# Patient Record
Sex: Female | Born: 1950 | Race: Black or African American | Hispanic: No | Smoking: Never smoker
Health system: Southern US, Community
[De-identification: ages and names within clinical notes are randomized; demographics above are authoritative.]

## PROBLEM LIST (undated history)

## (undated) DIAGNOSIS — Z973 Presence of spectacles and contact lenses: Secondary | ICD-10-CM

## (undated) HISTORY — PX: DENTAL SURGERY: SHX609

## (undated) HISTORY — PX: COLONOSCOPY: SHX174

---

## 1998-01-21 ENCOUNTER — Ambulatory Visit (HOSPITAL_COMMUNITY): Admission: RE | Admit: 1998-01-21 | Discharge: 1998-01-21 | Payer: Self-pay | Admitting: Obstetrics and Gynecology

## 1998-04-26 ENCOUNTER — Other Ambulatory Visit: Admission: RE | Admit: 1998-04-26 | Discharge: 1998-04-26 | Payer: Self-pay | Admitting: Obstetrics and Gynecology

## 1998-05-21 ENCOUNTER — Emergency Department (HOSPITAL_COMMUNITY): Admission: EM | Admit: 1998-05-21 | Discharge: 1998-05-21 | Payer: Self-pay | Admitting: Emergency Medicine

## 1998-07-27 ENCOUNTER — Other Ambulatory Visit: Admission: RE | Admit: 1998-07-27 | Discharge: 1998-07-27 | Payer: Self-pay | Admitting: Obstetrics and Gynecology

## 1999-03-28 ENCOUNTER — Other Ambulatory Visit: Admission: RE | Admit: 1999-03-28 | Discharge: 1999-03-28 | Payer: Self-pay | Admitting: Obstetrics and Gynecology

## 1999-04-21 ENCOUNTER — Ambulatory Visit (HOSPITAL_COMMUNITY): Admission: RE | Admit: 1999-04-21 | Discharge: 1999-04-21 | Payer: Self-pay | Admitting: Obstetrics and Gynecology

## 1999-04-21 ENCOUNTER — Encounter: Payer: Self-pay | Admitting: Obstetrics and Gynecology

## 2000-01-25 ENCOUNTER — Other Ambulatory Visit: Admission: RE | Admit: 2000-01-25 | Discharge: 2000-01-25 | Payer: Self-pay | Admitting: Obstetrics and Gynecology

## 2002-02-03 ENCOUNTER — Other Ambulatory Visit: Admission: RE | Admit: 2002-02-03 | Discharge: 2002-02-03 | Payer: Self-pay | Admitting: Obstetrics and Gynecology

## 2002-02-12 ENCOUNTER — Ambulatory Visit (HOSPITAL_COMMUNITY): Admission: RE | Admit: 2002-02-12 | Discharge: 2002-02-12 | Payer: Self-pay | Admitting: Obstetrics and Gynecology

## 2002-02-12 ENCOUNTER — Encounter: Payer: Self-pay | Admitting: Obstetrics and Gynecology

## 2004-08-03 ENCOUNTER — Encounter: Admission: RE | Admit: 2004-08-03 | Discharge: 2004-08-03 | Payer: Self-pay | Admitting: Cardiology

## 2004-08-04 ENCOUNTER — Ambulatory Visit (HOSPITAL_COMMUNITY): Admission: RE | Admit: 2004-08-04 | Discharge: 2004-08-04 | Payer: Self-pay | Admitting: Cardiovascular Disease

## 2004-08-09 ENCOUNTER — Other Ambulatory Visit: Admission: RE | Admit: 2004-08-09 | Discharge: 2004-08-09 | Payer: Self-pay | Admitting: Obstetrics and Gynecology

## 2004-08-12 ENCOUNTER — Ambulatory Visit (HOSPITAL_COMMUNITY): Admission: RE | Admit: 2004-08-12 | Discharge: 2004-08-12 | Payer: Self-pay | Admitting: Obstetrics and Gynecology

## 2006-07-17 ENCOUNTER — Other Ambulatory Visit: Admission: RE | Admit: 2006-07-17 | Discharge: 2006-07-17 | Payer: Self-pay | Admitting: Obstetrics and Gynecology

## 2006-11-02 ENCOUNTER — Ambulatory Visit (HOSPITAL_COMMUNITY): Admission: RE | Admit: 2006-11-02 | Discharge: 2006-11-02 | Payer: Self-pay | Admitting: Obstetrics and Gynecology

## 2008-01-07 ENCOUNTER — Ambulatory Visit (HOSPITAL_COMMUNITY): Admission: RE | Admit: 2008-01-07 | Discharge: 2008-01-07 | Payer: Self-pay | Admitting: Obstetrics and Gynecology

## 2009-08-09 ENCOUNTER — Emergency Department (HOSPITAL_COMMUNITY): Admission: EM | Admit: 2009-08-09 | Discharge: 2009-08-09 | Payer: Self-pay | Admitting: Emergency Medicine
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
## 2009-09-27 ENCOUNTER — Encounter: Admission: RE | Admit: 2009-09-27 | Discharge: 2009-09-27 | Payer: Self-pay | Admitting: Neurology

## 2009-12-30 ENCOUNTER — Ambulatory Visit (HOSPITAL_BASED_OUTPATIENT_CLINIC_OR_DEPARTMENT_OTHER): Admission: RE | Admit: 2009-12-30 | Discharge: 2009-12-30 | Payer: Self-pay | Admitting: Neurology

## 2010-01-01 ENCOUNTER — Ambulatory Visit: Payer: Self-pay | Admitting: Internal Medicine

## 2010-01-06 ENCOUNTER — Ambulatory Visit: Payer: Self-pay | Admitting: Psychology

## 2010-02-17 ENCOUNTER — Ambulatory Visit (HOSPITAL_COMMUNITY): Admission: RE | Admit: 2010-02-17 | Discharge: 2010-02-17 | Payer: Self-pay | Admitting: Obstetrics and Gynecology

## 2010-12-24 ENCOUNTER — Encounter: Payer: Self-pay | Admitting: Obstetrics and Gynecology

## 2010-12-25 ENCOUNTER — Encounter: Payer: Self-pay | Admitting: Obstetrics and Gynecology

## 2010-12-25 ENCOUNTER — Encounter: Payer: Self-pay | Admitting: Neurology

## 2011-04-21 NOTE — Cardiovascular Report (Signed)
Gina Barnes, Gina Barnes             ACCOUNT NO.:  [id number]   MEDICAL RECORD NO.:  [id number]          PATIENT TYPE:  OIB   LOCATION:  2899                         FACILITY:  [facility]   PHYSICIAN:  Madaline Savage, M.D.DATE OF BIRTH:  12-19-1950   DATE OF PROCEDURE:  08/04/2004  DATE OF DISCHARGE:  08/04/2004                              CARDIAC CATHETERIZATION   PROCEDURES PERFORMED:  1.  Selective coronary angiography by Judkins technique.  2.  Retrograde left heart catheterization.  3.  Left ventricular angiography.  4.  AngioSeal percutaneous femoral arteriotomy.   COMPLICATIONS:  None.   ENTRY SITE:  Right   <FEMORAL DYE USED/>  Omnipaque.   PATIENT PROFILE:  The patient is a 60 year old African-American female who  has recently seen and another cardiologist for chest pain and apparently had  had an outpatient Cardiolite stress test showing some abnormality, and  cardiac catheterization was recommended. The patient saw me for a second  opinion consultation and was also felt to warrant cardiac cath based on her  chest pain symptoms and her abnormal Cardiolite. This study was therefore  performed on August 04, 2004 without complications.   RESULTS:  Pressures:  The left ventricular pressure was 135/40.  End-  diastolic pressure 13. Central aortic pressure 135/60, mean 90. No aortic  valve gradient by pullback technique.   ANGIOGRAPHIC RESULTS:  Fluoroscopy:  The heart failed to show any evidence  of pericardial valvular coronary calcification.   The left main coronary artery was large in diameter, medium in length, and  showed no lesions. The LAD coursed to the cardiac apex, giving rise to one  major diagonal branch arising between the first and second septal perforator  branches. There was also a smaller diagonal branch arising near the first  septal perforator branch. Both the diagonals and the LAD itself were normal  and no lesions seen.   The circumflex coronary  artery was nondominant and showed no lesions.   The right coronary artery was a medium size vessel, giving rise to a  posterior descending branch and all side branches and RCA itself were  normal.   Left ventricular angiogram showed normal contractility with no wall motion  abnormalities appreciated.  The ascending aorta appeared normal. I did not  see any evidence of mitral prolapse or regurgitation. A right femoral  arteriogram was performed in the 45 degrees RAO projection in anticipation  of AngioSeal placement. The sheath appeared to be in a favorable position  for successful Angio-Seal closure.  Angio-Seal closure was accomplished with  excellent results.   FINAL DIAGNOSIS:  1.  Angiographic patent coronary arteries.  2.  Normal left ventricular systolic function.  3.  Successful AngioSeal closure of the right femoral artery.   PLAN:  Reassurance. The patient will be discharged in 2 hours and followed  up on an outpatient basis for any further needs       WHG/MEDQ  D:  12/16/2004  T:  12/16/2004  Job:  [id number]   cc:   [facility] cath lab

## 2014-01-16 ENCOUNTER — Emergency Department (HOSPITAL_COMMUNITY)
Admission: EM | Admit: 2014-01-16 | Discharge: 2014-01-16 | Disposition: A | Discharge status: Home / Self Care | Payer: BC Managed Care – PPO | Attending: Emergency Medicine | Admitting: Emergency Medicine

## 2014-01-16 ENCOUNTER — Encounter (HOSPITAL_COMMUNITY): Payer: Self-pay | Admitting: Emergency Medicine

## 2014-01-16 ENCOUNTER — Emergency Department (HOSPITAL_COMMUNITY): Payer: BC Managed Care – PPO

## 2014-01-16 DIAGNOSIS — W1809XA Striking against other object with subsequent fall, initial encounter: Secondary | ICD-10-CM | POA: Insufficient documentation

## 2014-01-16 DIAGNOSIS — S82409A Unspecified fracture of shaft of unspecified fibula, initial encounter for closed fracture: Secondary | ICD-10-CM | POA: Insufficient documentation

## 2014-01-16 DIAGNOSIS — S82839A Other fracture of upper and lower end of unspecified fibula, initial encounter for closed fracture: Secondary | ICD-10-CM

## 2014-01-16 DIAGNOSIS — S93409A Sprain of unspecified ligament of unspecified ankle, initial encounter: Secondary | ICD-10-CM | POA: Insufficient documentation

## 2014-01-16 DIAGNOSIS — Y939 Activity, unspecified: Secondary | ICD-10-CM | POA: Insufficient documentation

## 2014-01-16 DIAGNOSIS — Y92009 Unspecified place in unspecified non-institutional (private) residence as the place of occurrence of the external cause: Secondary | ICD-10-CM | POA: Insufficient documentation

## 2014-01-16 DIAGNOSIS — W010XXA Fall on same level from slipping, tripping and stumbling without subsequent striking against object, initial encounter: Secondary | ICD-10-CM | POA: Insufficient documentation

## 2014-01-16 DIAGNOSIS — Z880 Allergy status to penicillin: Secondary | ICD-10-CM | POA: Insufficient documentation

## 2014-01-16 LAB — BASIC METABOLIC PANEL
BUN: 12 mg/dL (ref 6–23)
CALCIUM: 8.8 mg/dL (ref 8.4–10.5)
CHLORIDE: 106 meq/L (ref 96–112)
CO2: 22 mEq/L (ref 19–32)
CREATININE: 0.75 mg/dL (ref 0.50–1.10)
GFR calc Af Amer: >90 mL/min (ref >90)
GFR calc non Af Amer: 88 mL/min — ABNORMAL LOW (ref >90)
GLUCOSE: 125 mg/dL — AB (ref 70–99)
POTASSIUM: 3.6 meq/L — AB (ref 3.7–5.3)
SODIUM: 143 meq/L (ref 137–147)

## 2014-01-16 LAB — CG4 I-STAT (LACTIC ACID): Lactic Acid, Venous: 0.79 mmol/L (ref 0.5–2.2)

## 2014-01-16 LAB — CBC WITH DIFFERENTIAL/PLATELET
BASOS PCT: 0 % (ref 0–1)
Basophils Absolute: 0 10*3/uL (ref 0.0–0.1)
EOS ABS: 0 10*3/uL (ref 0.0–0.7)
EOS PCT: 1 % (ref 0–5)
HCT: 34.9 % — ABNORMAL LOW (ref 36.0–46.0)
HEMOGLOBIN: 12 g/dL (ref 12.0–15.0)
Lymphocytes Relative: 24 % (ref 12–46)
Lymphs Abs: 1.4 10*3/uL (ref 0.7–4.0)
MCH: 30.9 pg (ref 26.0–34.0)
MCHC: 34.4 g/dL (ref 30.0–36.0)
MCV: 89.9 fL (ref 78.0–100.0)
Monocytes Absolute: 0.5 10*3/uL (ref 0.1–1.0)
Monocytes Relative: 8 % (ref 3–12)
NEUTROS ABS: 3.8 10*3/uL (ref 1.7–7.7)
Neutrophils Relative %: 67 % (ref 43–77)
PLATELETS: 248 10*3/uL (ref 150–400)
RBC: 3.88 MIL/uL (ref 3.87–5.11)
RDW: 12.5 % (ref 11.5–15.5)
WBC: 5.7 10*3/uL (ref 4.0–10.5)

## 2014-01-16 MED ORDER — HYDROCODONE-ACETAMINOPHEN 5-325 MG PO TABS
1.0000 | ORAL_TABLET | ORAL | Status: DC | PRN
  Administered 2014-01-16: 2 via ORAL
  Filled 2014-01-16: qty 2

## 2014-01-16 MED ORDER — SODIUM CHLORIDE 0.9 % IV SOLN
1000.0000 mL | Freq: Once | INTRAVENOUS | Status: AC
  Administered 2014-01-16: 1000 mL via INTRAVENOUS

## 2014-01-16 MED ORDER — HYDROCODONE-ACETAMINOPHEN 5-325 MG PO TABS: 1.0000 | ORAL_TABLET | ORAL | Status: DC | PRN

## 2014-01-16 MED ORDER — SODIUM CHLORIDE 0.9 % IV SOLN: 1000.0000 mL | INTRAVENOUS | Status: DC

## 2014-01-16 NOTE — ED Notes (Addendum)
Patient complained of feeling faint during discharge teaching. SBP found to be 64 in seated position. Patient assisted to supine position SBP recovered to 95 and a few minutes later 123/61. Patient asked to sit at side of bed again blood pressure dropped to 94/51. MD Norlene Campbelltter informed of new symptoms.

## 2014-01-16 NOTE — ED Notes (Signed)
Ortho Tech at bedside.  

## 2014-01-16 NOTE — Discharge Instructions (Signed)
°  Ankle Fracture °A fracture is a break in the bone. A cast or splint is used to protect and keep your injured bone from moving.  °HOME CARE INSTRUCTIONS  °· Use your crutches as directed. °· To lessen the swelling, keep the injured leg elevated while sitting or lying down. °· Apply ice to the injury for 15-20 minutes, 03-04 times per day while awake for 2 days. Put the ice in a plastic bag and place a thin towel between the bag of ice and your cast. °· If you have a plaster or fiberglass cast: °· Do not try to scratch the skin under the cast using sharp or pointed objects. °· Check the skin around the cast every day. You may put lotion on any red or sore areas. °· Keep your cast dry and clean. °· If you have a plaster splint: °· Wear the splint as directed. °· You may loosen the elastic around the splint if your toes become numb, tingle, or turn cold or blue. °· Do not put pressure on any part of your cast or splint; it may break. Rest your cast only on a pillow the first 24 hours until it is fully hardened. °· Your cast or splint can be protected during bathing with a plastic bag. Do not lower the cast or splint into water. °· Take medications as directed by your caregiver. Only take over-the-counter or prescription medicines for pain, discomfort, or fever as directed by your caregiver. °· Do not drive a vehicle until your caregiver specifically tells you it is safe to do so. °· If your caregiver has given you a follow-up appointment, it is very important to keep that appointment. Not keeping the appointment could result in a chronic or permanent injury, pain, and disability. If there is any problem keeping the appointment, you must call back to this facility for assistance. °SEEK IMMEDIATE MEDICAL CARE IF:  °· Your cast gets damaged or breaks. °· You have continued severe pain or more swelling than you did before the cast was put on. °· Your skin or toenails below the injury turn blue or gray, or feel cold or  numb. °· There is a bad smell or new stains and/or purulent (pus like) drainage coming from under the cast. °If you do not have a window in your cast for observing the wound, a discharge or minor bleeding may show up as a stain on the outside of your cast. Report these findings to your caregiver. °MAKE SURE YOU:  °· Understand these instructions. °· Will watch your condition. °· Will get help right away if you are not doing well or get worse. °Document Released: 11/17/2000 Document Revised: 02/12/2012 Document Reviewed: 06/19/2013 °[employer]® Patient Information ©2014 [employer]. ° ° °

## 2014-01-16 NOTE — ED Notes (Signed)
Patient brought from home via EMS with complaint of mechanical fall. Complains of having fallen in kitchen after slipping on floor and struck posterior head. No tenderness or swelling at site. Denies LOC, no neuro deficits, A+O x4.

## 2014-01-16 NOTE — ED Notes (Signed)
Ortho tech is at the bedside.

## 2014-01-16 NOTE — ED Notes (Signed)
Ortho tech paged and responded. 

## 2014-01-16 NOTE — ED Provider Notes (Addendum)
CSN: [id number]     Arrival date & time 01/16/14  0111 History   First MD Initiated Contact with Patient 01/16/14 0141     Chief Complaint  Patient presents with  . Fall  . Ankle Pain     (Consider location/radiation/quality/duration/timing/severity/associated sxs/prior Treatment) HPI 63 year old female presents to emergency room from home via EMS after a fall.  Patient reports she had a mechanical fall, where she slipped and fell.  She reports she twisted her right ankle, and struck the back of her head.  She denies LOC.  No nausea no vomiting.  No change in her mental status.  Patient has been unable to bear weight on her right ankle secondary to pain.  Patient has no medical problems, no medications.   History reviewed. No pertinent past medical history. History reviewed. No pertinent past surgical history. History reviewed. No pertinent family history. History  Substance Use Topics  . Smoking status: Never Smoker   . Smokeless tobacco: Not on file  . Alcohol Use: Yes     Comment: Social   OB History   Grav Para Term Preterm Abortions TAB SAB Ect Mult Living                 Review of Systems  See History of Present Illness; otherwise all other systems are reviewed and negative   Allergies  Penicillins  Home Medications  No current outpatient prescriptions on file. BP 153/78  Pulse 77  Temp(Src) 97.6 F (36.4 C) (Oral)  Resp 22  SpO2 100% Physical Exam  Nursing note and vitals reviewed. Constitutional: She is oriented to person, place, and time. She appears well-developed and well-nourished.  HENT:  Head: Normocephalic and atraumatic.  Right Ear: External ear normal.  Left Ear: External ear normal.  Nose: Nose normal.  Mouth/Throat: Oropharynx is clear and moist.  Eyes: Conjunctivae and EOM are normal. Pupils are equal, round, and reactive to light.  Neck: Normal range of motion. Neck supple. No JVD present. No tracheal deviation present. No thyromegaly present.   Cardiovascular: Normal rate, regular rhythm, normal heart sounds and intact distal pulses.  Exam reveals no gallop and no friction rub.   No murmur heard. Pulmonary/Chest: Effort normal and breath sounds normal. No stridor. No respiratory distress. She has no wheezes. She has no rales. She exhibits no tenderness.  Abdominal: Soft. Bowel sounds are normal. She exhibits no distension and no mass. There is no tenderness. There is no rebound and no guarding.  Musculoskeletal: Normal range of motion. She exhibits edema and tenderness.  Patient has swelling and pain to her right ankle.  Pain is worse over lateral malleolus.  There is no step-off or crepitus.  There is swelling over bilateral malleoli.  Patient has decreased range of motion secondary to pain.  She has normal cap refill and pulses distally.  Sensation is normal.  She has no injury or pain proximal to the ankle  Lymphadenopathy:    She has no cervical adenopathy.  Neurological: She is oriented to person, place, and time. She has normal reflexes. No cranial nerve deficit. She exhibits normal muscle tone. Coordination normal.  Skin: Skin is dry. No rash noted. No erythema. No pallor.  Psychiatric: She has a normal mood and affect. Her behavior is normal. Judgment and thought content normal.    ED Course  Procedures (including critical care time) Labs Review Labs Reviewed  CBC WITH DIFFERENTIAL - Abnormal; Notable for the following:    HCT 34.9 (*)  All other components within normal limits  BASIC METABOLIC PANEL - Abnormal; Notable for the following:    Potassium 3.6 (*)    Glucose, Bld 125 (*)    GFR calc non Af Amer 88 (*)    All other components within normal limits  CG4 I-STAT (LACTIC ACID)   Imaging Review Dg Tibia/fibula Right  01/16/2014   CLINICAL DATA:  Fall.  Ankle pain.  EXAM: RIGHT TIBIA AND FIBULA - 2 VIEW  COMPARISON:  None.  FINDINGS: The proximal tibia and fibula are intact. See ankle dictation for ankle  fractures.  IMPRESSION: Intact proximal left tibia and fibula.   Electronically Signed   By: Andreas Newport M.D.   On: 01/16/2014 03:20   Dg Ankle Complete Right  01/16/2014   CLINICAL DATA:  Fall.  Ankle pain.  Ankle fracture.  EXAM: RIGHT ANKLE - COMPLETE 3+ VIEW  COMPARISON:  DG TIBIA/FIBULA*R* dated 01/16/2014  FINDINGS: Oblique fracture of the distal fibula is present extending into the lateral malleolus. 3 mm lateral displacement of the lateral malleolus. Medial malleolus appears intact. Pes planus incidentally noted. There is distraction of the anterior ankle joint. The posterior malleolus appears intact on the lateral view. Small osteochondral lesion of the lateral talar dome. Tibial plafond appears intact.  IMPRESSION: Oblique fracture of the distal fibular metaphysis, mildly displaced. Mild distraction of the anterior tibiotalar joint suggesting ligamentous injury.   Electronically Signed   By: Andreas Newport M.D.   On: 01/16/2014 03:21    EKG Interpretation   None       MDM   Final diagnoses:  Fracture of distal fibula  Ankle sprain   63 year old female with distal fibula fracture and ankle sprain.  Plan the patient placed in splint, crutches, and have patient followup with orthopedics.    Olivia Mackie, MD 01/16/14 0353  5:01 AM The patient with orthostatic hypotension, and dizziness upon discharge.  This may be secondary to pain medication, or possible dehydration.  We'll check labs, give IV fluids.   Labs are normal.  Patient feels better after IV fluids.  No further orthostasis  Olivia Mackie, MD 01/16/14 [phone]

## 2014-01-22 ENCOUNTER — Other Ambulatory Visit: Payer: Self-pay | Admitting: Orthopedic Surgery

## 2014-01-23 ENCOUNTER — Encounter (HOSPITAL_BASED_OUTPATIENT_CLINIC_OR_DEPARTMENT_OTHER): Payer: Self-pay | Admitting: *Deleted

## 2014-01-23 NOTE — Progress Notes (Signed)
No more labs needed-had labs er 01/16/14

## 2014-01-26 ENCOUNTER — Encounter (HOSPITAL_BASED_OUTPATIENT_CLINIC_OR_DEPARTMENT_OTHER)
Admission: RE | Disposition: A | Discharge status: Home / Self Care | Payer: Self-pay | Source: Ambulatory Visit | Attending: Orthopedic Surgery

## 2014-01-26 ENCOUNTER — Ambulatory Visit (HOSPITAL_COMMUNITY): Payer: BC Managed Care – PPO

## 2014-01-26 ENCOUNTER — Ambulatory Visit (HOSPITAL_BASED_OUTPATIENT_CLINIC_OR_DEPARTMENT_OTHER): Payer: BC Managed Care – PPO | Admitting: Anesthesiology

## 2014-01-26 ENCOUNTER — Ambulatory Visit (HOSPITAL_BASED_OUTPATIENT_CLINIC_OR_DEPARTMENT_OTHER)
Admission: RE | Admit: 2014-01-26 | Discharge: 2014-01-26 | Disposition: A | Discharge status: Home / Self Care | Payer: BC Managed Care – PPO | Source: Ambulatory Visit | Attending: Orthopedic Surgery | Admitting: Orthopedic Surgery

## 2014-01-26 ENCOUNTER — Encounter (HOSPITAL_BASED_OUTPATIENT_CLINIC_OR_DEPARTMENT_OTHER): Payer: Self-pay

## 2014-01-26 ENCOUNTER — Encounter (HOSPITAL_BASED_OUTPATIENT_CLINIC_OR_DEPARTMENT_OTHER): Payer: BC Managed Care – PPO | Admitting: Anesthesiology

## 2014-01-26 DIAGNOSIS — W19XXXA Unspecified fall, initial encounter: Secondary | ICD-10-CM | POA: Insufficient documentation

## 2014-01-26 DIAGNOSIS — S82839A Other fracture of upper and lower end of unspecified fibula, initial encounter for closed fracture: Secondary | ICD-10-CM

## 2014-01-26 DIAGNOSIS — S82899A Other fracture of unspecified lower leg, initial encounter for closed fracture: Secondary | ICD-10-CM | POA: Insufficient documentation

## 2014-01-26 HISTORY — DX: Presence of spectacles and contact lenses: Z97.3

## 2014-01-26 HISTORY — PX: ORIF FIBULA FRACTURE: SHX5114

## 2014-01-26 LAB — POCT HEMOGLOBIN-HEMACUE: HEMOGLOBIN: 10.8 g/dL — AB (ref 12.0–15.0)

## 2014-01-26 SURGERY — OPEN REDUCTION INTERNAL FIXATION (ORIF) FIBULA FRACTURE
Anesthesia: General | Site: Ankle | Laterality: Right

## 2014-01-26 MED ORDER — BUPIVACAINE-EPINEPHRINE PF 0.5-1:200000 % IJ SOLN
INTRAMUSCULAR | Status: DC | PRN
  Administered 2014-01-26: 150 mg via PERINEURAL

## 2014-01-26 MED ORDER — HYDROMORPHONE HCL PF 1 MG/ML IJ SOLN: 0.2500 mg | INTRAMUSCULAR | Status: DC | PRN

## 2014-01-26 MED ORDER — PROPOFOL 10 MG/ML IV BOLUS
INTRAVENOUS | Status: DC | PRN
  Administered 2014-01-26: 200 mg via INTRAVENOUS

## 2014-01-26 MED ORDER — MIDAZOLAM HCL 2 MG/2ML IJ SOLN
1.0000 mg | INTRAMUSCULAR | Status: DC | PRN
  Administered 2014-01-26: 2 mg via INTRAVENOUS

## 2014-01-26 MED ORDER — OXYCODONE HCL 5 MG/5ML PO SOLN: 5.0000 mg | Freq: Once | ORAL | Status: DC | PRN

## 2014-01-26 MED ORDER — FENTANYL CITRATE 0.05 MG/ML IJ SOLN
INTRAMUSCULAR | Status: DC | PRN
  Administered 2014-01-26 (×2): 50 ug via INTRAVENOUS

## 2014-01-26 MED ORDER — CLINDAMYCIN PHOSPHATE 900 MG/50ML IV SOLN
900.0000 mg | INTRAVENOUS | Status: AC
  Administered 2014-01-26: 900 mg via INTRAVENOUS

## 2014-01-26 MED ORDER — 0.9 % SODIUM CHLORIDE (POUR BTL) OPTIME
TOPICAL | Status: DC | PRN
  Administered 2014-01-26: 200 mL

## 2014-01-26 MED ORDER — FENTANYL CITRATE 0.05 MG/ML IJ SOLN
INTRAMUSCULAR | Status: AC
  Filled 2014-01-26: qty 4

## 2014-01-26 MED ORDER — BUPIVACAINE HCL (PF) 0.5 % IJ SOLN
INTRAMUSCULAR | Status: AC
  Filled 2014-01-26: qty 30

## 2014-01-26 MED ORDER — FENTANYL CITRATE 0.05 MG/ML IJ SOLN
INTRAMUSCULAR | Status: AC
  Filled 2014-01-26: qty 2

## 2014-01-26 MED ORDER — OXYCODONE-ACETAMINOPHEN 5-325 MG PO TABS: 1.0000 | ORAL_TABLET | ORAL | Status: AC | PRN

## 2014-01-26 MED ORDER — POVIDONE-IODINE 7.5 % EX SOLN: Freq: Once | CUTANEOUS | Status: DC

## 2014-01-26 MED ORDER — FENTANYL CITRATE 0.05 MG/ML IJ SOLN
50.0000 ug | INTRAMUSCULAR | Status: DC | PRN
  Administered 2014-01-26: 100 ug via INTRAVENOUS

## 2014-01-26 MED ORDER — MIDAZOLAM HCL 2 MG/2ML IJ SOLN
INTRAMUSCULAR | Status: AC
  Filled 2014-01-26: qty 2

## 2014-01-26 MED ORDER — OXYCODONE HCL 5 MG PO TABS: 5.0000 mg | ORAL_TABLET | Freq: Once | ORAL | Status: DC | PRN

## 2014-01-26 MED ORDER — DEXAMETHASONE SODIUM PHOSPHATE 4 MG/ML IJ SOLN
INTRAMUSCULAR | Status: DC | PRN
  Administered 2014-01-26: 10 mg via INTRAVENOUS

## 2014-01-26 MED ORDER — CLINDAMYCIN PHOSPHATE 900 MG/50ML IV SOLN
INTRAVENOUS | Status: AC
  Filled 2014-01-26: qty 50

## 2014-01-26 MED ORDER — LACTATED RINGERS IV SOLN
INTRAVENOUS | Status: DC
  Administered 2014-01-26 (×2): via INTRAVENOUS

## 2014-01-26 MED ORDER — ONDANSETRON HCL 4 MG/2ML IJ SOLN
INTRAMUSCULAR | Status: DC | PRN
  Administered 2014-01-26: 4 mg via INTRAVENOUS

## 2014-01-26 MED ORDER — DEXAMETHASONE SODIUM PHOSPHATE 10 MG/ML IJ SOLN
INTRAMUSCULAR | Status: DC | PRN
  Administered 2014-01-26: 8 mg

## 2014-01-26 MED ORDER — DOCUSATE SODIUM 100 MG PO CAPS: 100.0000 mg | ORAL_CAPSULE | Freq: Three times a day (TID) | ORAL | Status: AC | PRN

## 2014-01-26 MED ORDER — PROMETHAZINE HCL 25 MG/ML IJ SOLN: 6.2500 mg | INTRAMUSCULAR | Status: DC | PRN

## 2014-01-26 SURGICAL SUPPLY — 78 items
APL SKNCLS STERI-STRIP NONHPOA (GAUZE/BANDAGES/DRESSINGS)
BANDAGE ELASTIC 4 VELCRO ST LF (GAUZE/BANDAGES/DRESSINGS) ×3 IMPLANT
BANDAGE ELASTIC 6 VELCRO ST LF (GAUZE/BANDAGES/DRESSINGS) ×2 IMPLANT
BANDAGE ESMARK 6X9 LF (GAUZE/BANDAGES/DRESSINGS) ×1 IMPLANT
BENZOIN TINCTURE PRP APPL 2/3 (GAUZE/BANDAGES/DRESSINGS) IMPLANT
BIT DRILL SOLID 2.5X110 (BIT) ×2 IMPLANT
BIT DRILL SOLID 3.5X110 (BIT) ×2 IMPLANT
BLADE SURG 15 STRL LF DISP TIS (BLADE) ×2 IMPLANT
BLADE SURG 15 STRL SS (BLADE) ×6
BNDG CMPR 9X4 STRL LF SNTH (GAUZE/BANDAGES/DRESSINGS)
BNDG CMPR 9X6 STRL LF SNTH (GAUZE/BANDAGES/DRESSINGS) ×1
BNDG COHESIVE 4X5 TAN STRL (GAUZE/BANDAGES/DRESSINGS) ×3 IMPLANT
BNDG ESMARK 4X9 LF (GAUZE/BANDAGES/DRESSINGS) IMPLANT
BNDG ESMARK 6X9 LF (GAUZE/BANDAGES/DRESSINGS) ×3
CANISTER SUCT 1200ML W/VALVE (MISCELLANEOUS) ×2 IMPLANT
CHLORAPREP W/TINT 26ML (MISCELLANEOUS) ×3 IMPLANT
CLOSURE WOUND 1/2 X4 (GAUZE/BANDAGES/DRESSINGS)
COVER TABLE BACK 60X90 (DRAPES) ×3 IMPLANT
DECANTER SPIKE VIAL GLASS SM (MISCELLANEOUS) IMPLANT
DRAPE EXTREMITY T 121X128X90 (DRAPE) ×3 IMPLANT
DRAPE OEC MINIVIEW 54X84 (DRAPES) ×1 IMPLANT
DRAPE U 20/CS (DRAPES) ×3 IMPLANT
DRAPE U-SHAPE 47X51 STRL (DRAPES) ×3 IMPLANT
DRSG EMULSION OIL 3X3 NADH (GAUZE/BANDAGES/DRESSINGS) ×1 IMPLANT
ELECT REM PT RETURN 9FT ADLT (ELECTROSURGICAL) ×3
ELECTRODE REM PT RTRN 9FT ADLT (ELECTROSURGICAL) ×1 IMPLANT
GAUZE SPONGE 4X4 16PLY XRAY LF (GAUZE/BANDAGES/DRESSINGS) IMPLANT
GLOVE BIO SURGEON STRL SZ7 (GLOVE) ×3 IMPLANT
GLOVE BIO SURGEON STRL SZ7.5 (GLOVE) ×9 IMPLANT
GLOVE BIOGEL PI IND STRL 7.0 (GLOVE) ×1 IMPLANT
GLOVE BIOGEL PI IND STRL 8 (GLOVE) ×1 IMPLANT
GLOVE BIOGEL PI INDICATOR 7.0 (GLOVE) ×4
GLOVE BIOGEL PI INDICATOR 8 (GLOVE) ×2
GOWN STRL REUS W/ TWL LRG LVL3 (GOWN DISPOSABLE) ×1 IMPLANT
GOWN STRL REUS W/ TWL XL LVL3 (GOWN DISPOSABLE) ×1 IMPLANT
GOWN STRL REUS W/TWL LRG LVL3 (GOWN DISPOSABLE) ×6
GOWN STRL REUS W/TWL XL LVL3 (GOWN DISPOSABLE) ×3
NEEDLE HYPO 22GX1.5 SAFETY (NEEDLE) IMPLANT
NS IRRIG 1000ML POUR BTL (IV SOLUTION) ×3 IMPLANT
PACK BASIN DAY SURGERY FS (CUSTOM PROCEDURE TRAY) ×3 IMPLANT
PAD ABD 8X10 STRL (GAUZE/BANDAGES/DRESSINGS) ×6 IMPLANT
PAD CAST 4YDX4 CTTN HI CHSV (CAST SUPPLIES) ×1 IMPLANT
PADDING CAST ABS 4INX4YD NS (CAST SUPPLIES)
PADDING CAST ABS COTTON 4X4 ST (CAST SUPPLIES) ×1 IMPLANT
PADDING CAST COTTON 4X4 STRL (CAST SUPPLIES) ×3
PADDING CAST COTTON 6X4 STRL (CAST SUPPLIES) ×2 IMPLANT
PENCIL BUTTON HOLSTER BLD 10FT (ELECTRODE) ×3 IMPLANT
PLATE LCP 3.5 1/3 TUB 6HX69 (Plate) ×2 IMPLANT
SCREW CANC FT ST SFS 4X16 (Screw) ×4 IMPLANT
SCREW CORTEX 3.5 14MM (Screw) ×4 IMPLANT
SCREW CORTEX 3.5 16MM (Screw) ×2 IMPLANT
SCREW CORTEX 3.5 24MM (Screw) ×2 IMPLANT
SCREW LOCK CORT ST 3.5X14 (Screw) IMPLANT
SCREW LOCK CORT ST 3.5X16 (Screw) IMPLANT
SCREW LOCK CORT ST 3.5X24 (Screw) IMPLANT
SPLINT FAST PLASTER 5X30 (CAST SUPPLIES) ×30
SPLINT PLASTER CAST FAST 5X30 (CAST SUPPLIES) IMPLANT
SPONGE GAUZE 4X4 12PLY (GAUZE/BANDAGES/DRESSINGS) ×3 IMPLANT
SPONGE LAP 4X18 X RAY DECT (DISPOSABLE) ×2 IMPLANT
STAPLER VISISTAT (STAPLE) IMPLANT
STOCKINETTE 6  STRL (DRAPES) ×2
STOCKINETTE 6 STRL (DRAPES) ×1 IMPLANT
STRIP CLOSURE SKIN 1/2X4 (GAUZE/BANDAGES/DRESSINGS) IMPLANT
SUCTION FRAZIER TIP 10 FR DISP (SUCTIONS) ×2 IMPLANT
SUT ETHILON 3 0 PS 1 (SUTURE) ×3 IMPLANT
SUT ETHILON 4 0 PS 2 18 (SUTURE) IMPLANT
SUT MON AB 4-0 PC3 18 (SUTURE) IMPLANT
SUT VIC AB 2-0 SH 27 (SUTURE) ×3
SUT VIC AB 2-0 SH 27XBRD (SUTURE) ×1 IMPLANT
SUT VIC AB 3-0 FS2 27 (SUTURE) IMPLANT
SUT VICRYL 4-0 PS2 18IN ABS (SUTURE) IMPLANT
SYR 20CC LL (SYRINGE) IMPLANT
SYR BULB 3OZ (MISCELLANEOUS) ×3 IMPLANT
TOWEL OR NON WOVEN STRL DISP B (DISPOSABLE) ×3 IMPLANT
TUBE CONNECTING 20'X1/4 (TUBING) ×1
TUBE CONNECTING 20X1/4 (TUBING) ×1 IMPLANT
UNDERPAD 30X30 INCONTINENT (UNDERPADS AND DIAPERS) ×3 IMPLANT
YANKAUER SUCT BULB TIP NO VENT (SUCTIONS) ×3 IMPLANT

## 2014-01-26 NOTE — Anesthesia Procedure Notes (Addendum)
Anesthesia Regional Block:  Popliteal block  Pre-Anesthetic Checklist: ,, timeout performed, Correct Patient, Correct Site, Correct Laterality, Correct Procedure, Correct Position, site marked, Risks and benefits discussed,  Surgical consent,  Pre-op evaluation,  At surgeon's request and post-op pain management  Laterality: Right  Prep: chloraprep       Needles:  Injection technique: Single-shot  Needle Type: Echogenic Stimulator Needle          Additional Needles:  Procedures: ultrasound guided (picture in chart) and nerve stimulator Popliteal block  Nerve Stimulator or Paresthesia:  Response: plantar flexion, 0.45 mA,   Additional Responses:   Narrative:  Start time: 01/26/2014 10:28 AM End time: 01/26/2014 10:38 AM  Performed by: Personally  Anesthesiologist: J. Adonis Hugueninan Singer, MD  Additional Notes: A functioning IV was confirmed and monitors were applied.  Sterile prep and drape, hand hygiene and sterile gloves were used.  Negative aspiration and test dose prior to incremental administration of local anesthetic. The patient tolerated the procedure well.Ultrasound  guidance: relevant anatomy identified, needle position confirmed, local anesthetic spread visualized around nerve(s), vascular puncture avoided.  Image printed for medical record.    Procedure Name: LMA Insertion Date/Time: 01/26/2014 11:34 AM Performed by: Gar GibbonKEETON, Rayah Fines S Pre-anesthesia Checklist: Patient identified, Emergency Drugs available, Suction available and Patient being monitored Patient Re-evaluated:Patient Re-evaluated prior to inductionOxygen Delivery Method: Circle System Utilized Preoxygenation: Pre-oxygenation with 100% oxygen Intubation Type: IV induction Ventilation: Mask ventilation without difficulty LMA: LMA inserted LMA Size: 4.0 Number of attempts: 1 Airway Equipment and Method: bite block Placement Confirmation: positive ETCO2 Tube secured with: Tape Dental Injury: Teeth and  Oropharynx as per pre-operative assessment

## 2014-01-26 NOTE — Anesthesia Preprocedure Evaluation (Addendum)
Anesthesia Evaluation  Patient identified by MRN, date of birth, ID band Patient awake    Reviewed: Allergy & Precautions, H&P , NPO status , Patient's Chart, lab work & pertinent test results  Airway Mallampati: I TM Distance: >3 FB Neck ROM: Full    Dental  (+) Teeth Intact, Dental Advisory Given   Pulmonary neg pulmonary ROS,    Pulmonary exam normal       Cardiovascular negative cardio ROS      Neuro/Psych negative neurological ROS  negative psych ROS   GI/Hepatic negative GI ROS, Neg liver ROS,   Endo/Other  negative endocrine ROS  Renal/GU negative Renal ROS     Musculoskeletal   Abdominal Normal abdominal exam  (+)   Peds  Hematology   Anesthesia Other Findings   Reproductive/Obstetrics negative OB ROS                         Anesthesia Physical Anesthesia Plan  ASA: I  Anesthesia Plan: General LMA   Post-op Pain Management: MAC Combined w/ Regional for Post-op pain   Induction: Intravenous  Airway Management Planned: LMA  Additional Equipment:   Intra-op Plan:   Post-operative Plan: Extubation in OR  Informed Consent: I have reviewed the patients History and Physical, chart, labs and discussed the procedure including the risks, benefits and alternatives for the proposed anesthesia with the patient or authorized representative who has indicated his/her understanding and acceptance.   Dental advisory given  Plan Discussed with: CRNA, Anesthesiologist and Surgeon  Anesthesia Plan Comments:        Anesthesia Quick Evaluation

## 2014-01-26 NOTE — Op Note (Signed)
Procedure(s): Right ankle open reduction internal fixation fibula fracture Procedure Note  Gina PeltonClaudelia Barnes female 63 y.o. 01/26/2014  Procedure(s) and Anesthesia Type:    * Right ankle open reduction internal fixation fibula fracture - General  Surgeon(s) and Role:    * Mable ParisJustin William Jannette Cotham, MD - Primary   Indications:  63 y.o. female s/p fall with right ankle fracture. Indicated for surgery to promote anatomic restoration of joint.     Surgeon: Mable ParisHANDLER,Eschol Auxier WILLIAM   Assistants: Damita Lackanielle Lalibert PA-C [facility](Danielle was present and scrubbed throughout the procedure and was essential in positioning, retraction, exposure, and closure)  Anesthesia: General endotracheal anesthesia   Procedure Detail  Right ankle open reduction internal fixation fibula fracture  Findings: Synthes stainless steel 6 hole one third tubular plate with 1 interfragmentary screw, with 3 bicortical screw proximally and 2 unicortical screws distally. Bone quality was relatively poor  Estimated Blood Loss:  Minimal         Drains: none  Blood Given: none         Specimens: none        Complications:  * No complications entered in OR log *         Disposition: PACU - hemodynamically stable.         Condition: stable    Procedure:  The patient was identified in the preoperative  holding area where I personally marked the operative site after  verifying site side and procedure with the patient. The patient was taken back  to the operating room where general anesthesia was induced without  Complication. The patient was placed in supine position with a bump under the operative hip. A non sterile tourniquet was applied to the thigh. The patient did receive IV antibiotics prior to the incision.   After the appropriate time-out, the limb was exsanguinated and the tourniquet was elevated to 300 mmHg.   A lateral incision was made over the fracture site and dissection was carried down the lateral  fibula.  The fracture was a exposed and cleaned of hematoma.  Reduction was carried out using reduction forceps and manipulation of the ankle.  An interfragmentary lag screw was placed.  A stick hole plate was laid laterally and felt to be appropriate sized.  Holes proximal and distal to the fracture were sequentially drill, measured and filled with appropriate sized bicortical and unicortical screws.  Appropriate length and alignment were verified on fluoroscopic imaging.    The syndesmosis was stressed and felt to be intact.  The wound was then copiously irrigated and closed in layers with 2-0 vicryl in a deep layer and 3-0 nylon for skin closure.  Sterile dressings were then applied and well padded well molded splint in a plantigrade position was applied.  The tourniquet was let down. The patient was then allowed to awaken from GA, taken to the PACU in stable condition.  POSTOPERATIVE PLAN: The patient will be non-weightbearing on the operative Extremity and will follow up in 10-14 days for wound check.

## 2014-01-26 NOTE — H&P (Signed)
Gina PeltonClaudelia Barnes is an 63 y.o. female.   Chief Complaint: R ankle injury HPI: s/p fall with R ankle fracture.  Past Medical History  Diagnosis Date  . Wears glasses     Past Surgical History  Procedure Laterality Date  . Dental surgery    . Colonoscopy      History reviewed. No pertinent family history. Social History:  reports that she has never smoked. She does not have any smokeless tobacco history on file. She reports that she drinks alcohol. She reports that she does not use illicit drugs.  Allergies:  Allergies  Allergen Reactions  . Penicillins Itching    Medications Prior to Admission  Medication Sig Dispense Refill  . HYDROcodone-acetaminophen (NORCO/VICODIN) 5-325 MG per tablet Take 1-2 tablets by mouth every 4 (four) hours as needed for moderate pain.  30 tablet  0    No results found for this or any previous visit (from the past 48 hour(s)). No results found.  Review of Systems  All other systems reviewed and are negative.    Blood pressure 110/61, pulse 66, resp. rate 12, height 5' 4.5" (1.638 m), weight 72.576 kg (160 lb), SpO2 100.00%. Physical Exam  Constitutional: She is oriented to person, place, and time. She appears well-developed and well-nourished.  HENT:  Head: Atraumatic.  Eyes: EOM are normal.  Cardiovascular: Intact distal pulses.   Respiratory: Effort normal.  Musculoskeletal:  R ankle swollen and TTP medial and lateral.  Neurological: She is alert and oriented to person, place, and time.  Skin: Skin is warm and dry.  Psychiatric: She has a normal mood and affect.     Assessment/Plan Unstable Weber B ankle fracture Plan ORIF Risks / benefits of surgery discussed Consent on chart  NPO for OR Preop antibiotics   Gina Barnes Gina Barnes 01/26/2014, 11:16 AM

## 2014-01-26 NOTE — Transfer of Care (Signed)
Immediate Anesthesia Transfer of Care Note  Patient: [name]  Procedure(s) Performed: Procedure(s) with comments: Right ankle open reduction internal fixation fibula fracture (Right) - Right ankle open reduction internal fixation fibula fracture  Patient Location: PACU  Anesthesia Type:GA combined with regional for post-op pain  Level of Consciousness: awake, sedated and patient cooperative  Airway & Oxygen Therapy: Patient Spontanous Breathing and Patient connected to face mask oxygen  Post-op Assessment: Report given to PACU RN and Post -op Vital signs reviewed and stable  Post vital signs: Reviewed and stable  Complications: No apparent anesthesia complications

## 2014-01-26 NOTE — Progress Notes (Signed)
Assisted Dr. Singer with right, ultrasound guided, popliteal/saphenous block. Side rails up, monitors on throughout procedure. See vital signs in flow sheet. Tolerated Procedure well. 

## 2014-01-26 NOTE — Discharge Instructions (Signed)
Discharge Instructions after Ankle Surgery   You will have a splint on your leg, please leave this on until your first post op visit with Dr. Ave Filterhandler in two weeks, keep the splint clean and dry You may wiggle your toes frequently and elevate your leg as much as possible You may begin straight leg raising exercises (if you have a brace with it on). While lying down, pull your foot all the way up, tighten your quadriceps muscle and lift your heel off of the ground. Hold this position for 2 seconds, and then let the leg back down. Repeat the exercise 10 times, at least 3 times a day.  Pain medicine has been prescribed for you.  Use your medicine as needed over the first 48 hours, and then you can begin to taper your use. You may take Extra Strength Tylenol or Tylenol only in place of the pain pills. Do not take NSAIDs (Mortin, ibuprofen, naproxen). Take one 81mg  aspirin daily for 3 weeks post-operatively unless it upsets your stomach.   Please call [phone] during normal business hours or [phone] after hours for any problems. Including the following:  - excessive redness of the incisions - drainage for more than 4 days - fever of more than 101.5 F  *Please note that pain medications will not be refilled after hours or on weekends.    Post Anesthesia Home Care Instructions  Activity: Get plenty of rest for the remainder of the day. A responsible adult should stay with you for 24 hours following the procedure.  For the next 24 hours, DO NOT: -Drive a car -Advertising copywriterperate machinery -Drink alcoholic beverages -Take any medication unless instructed by your physician -Make any legal decisions or sign important papers.  Meals: Start with liquid foods such as gelatin or soup. Progress to regular foods as tolerated. Avoid greasy, spicy, heavy foods. If nausea and/or vomiting occur, drink only clear liquids until the nausea and/or vomiting subsides. Call your physician if vomiting  continues.  Special Instructions/Symptoms: Your throat may feel dry or sore from the anesthesia or the breathing tube placed in your throat during surgery. If this causes discomfort, gargle with warm salt water. The discomfort should disappear within 24 hours.   Regional Anesthesia Blocks  1. Numbness or the inability to move the "blocked" extremity may last from 3-48 hours after placement. The length of time depends on the medication injected and your individual response to the medication. If the numbness is not going away after 48 hours, call your surgeon.  2. The extremity that is blocked will need to be protected until the numbness is gone and the  Strength has returned. Because you cannot feel it, you will need to take extra care to avoid injury. Because it may be weak, you may have difficulty moving it or using it. You may not know what position it is in without looking at it while the block is in effect.  3. For blocks in the legs and feet, returning to weight bearing and walking needs to be done carefully. You will need to wait until the numbness is entirely gone and the strength has returned. You should be able to move your leg and foot normally before you try and bear weight or walk. You will need someone to be with you when you first try to ensure you do not fall and possibly risk injury.  4. Bruising and tenderness at the needle site are common side effects and will resolve in a few days.  5. Persistent numbness or new problems with movement should be communicated to the surgeon or the [place] [phone] [place] [phone]).

## 2014-01-26 NOTE — Anesthesia Postprocedure Evaluation (Signed)
Anesthesia Post Note  Patient: [name]  Procedure(s) Performed: Procedure(s) (LRB): Right ankle open reduction internal fixation fibula fracture (Right)  Anesthesia type: general  Patient location: PACU  Post pain: Pain level controlled  Post assessment: Patient's Cardiovascular Status Stable  Last Vitals:  Filed Vitals:   01/26/14 1315  BP: 125/59  Pulse: 57  Temp:   Resp: 14    Post vital signs: Reviewed and stable  Level of consciousness: sedated  Complications: No apparent anesthesia complications

## 2014-01-27 ENCOUNTER — Encounter (HOSPITAL_BASED_OUTPATIENT_CLINIC_OR_DEPARTMENT_OTHER): Payer: Self-pay | Admitting: Orthopedic Surgery

## 2015-02-26 ENCOUNTER — Encounter (HOSPITAL_COMMUNITY): Payer: Self-pay | Admitting: Family Medicine

## 2015-02-26 ENCOUNTER — Emergency Department (HOSPITAL_COMMUNITY): Payer: BC Managed Care – PPO

## 2015-02-26 ENCOUNTER — Emergency Department (HOSPITAL_COMMUNITY)
Admission: EM | Admit: 2015-02-26 | Discharge: 2015-02-26 | Disposition: A | Discharge status: Home / Self Care | Payer: BC Managed Care – PPO | Attending: Emergency Medicine | Admitting: Emergency Medicine

## 2015-02-26 DIAGNOSIS — Z79899 Other long term (current) drug therapy: Secondary | ICD-10-CM | POA: Insufficient documentation

## 2015-02-26 DIAGNOSIS — J159 Unspecified bacterial pneumonia: Secondary | ICD-10-CM | POA: Diagnosis not present

## 2015-02-26 DIAGNOSIS — Z880 Allergy status to penicillin: Secondary | ICD-10-CM | POA: Insufficient documentation

## 2015-02-26 DIAGNOSIS — E860 Dehydration: Secondary | ICD-10-CM | POA: Insufficient documentation

## 2015-02-26 DIAGNOSIS — R05 Cough: Secondary | ICD-10-CM | POA: Diagnosis present

## 2015-02-26 DIAGNOSIS — J189 Pneumonia, unspecified organism: Secondary | ICD-10-CM

## 2015-02-26 LAB — COMPREHENSIVE METABOLIC PANEL
ALK PHOS: 76 U/L (ref 39–117)
ALT: 24 U/L (ref 0–35)
AST: 34 U/L (ref 0–37)
Albumin: 3.5 g/dL (ref 3.5–5.2)
Anion gap: 11 (ref 5–15)
BILIRUBIN TOTAL: 0.8 mg/dL (ref 0.3–1.2)
BUN: 12 mg/dL (ref 6–23)
CO2: 25 mmol/L (ref 19–32)
Calcium: 9 mg/dL (ref 8.4–10.5)
Chloride: 100 mmol/L (ref 96–112)
Creatinine, Ser: 1.27 mg/dL — ABNORMAL HIGH (ref 0.50–1.10)
GFR calc Af Amer: 51 mL/min — ABNORMAL LOW (ref >90)
GFR calc non Af Amer: 44 mL/min — ABNORMAL LOW (ref >90)
Glucose, Bld: 139 mg/dL — ABNORMAL HIGH (ref 70–99)
POTASSIUM: 3.8 mmol/L (ref 3.5–5.1)
Sodium: 136 mmol/L (ref 135–145)
TOTAL PROTEIN: 7.6 g/dL (ref 6.0–8.3)

## 2015-02-26 LAB — CBC WITH DIFFERENTIAL/PLATELET
BASOS ABS: 0 10*3/uL (ref 0.0–0.1)
Basophils Relative: 0 % (ref 0–1)
Eosinophils Absolute: 0 10*3/uL (ref 0.0–0.7)
Eosinophils Relative: 0 % (ref 0–5)
HCT: 38.7 % (ref 36.0–46.0)
HEMOGLOBIN: 12.9 g/dL (ref 12.0–15.0)
LYMPHS ABS: 0.9 10*3/uL (ref 0.7–4.0)
LYMPHS PCT: 9 % — AB (ref 12–46)
MCH: 30 pg (ref 26.0–34.0)
MCHC: 33.3 g/dL (ref 30.0–36.0)
MCV: 90 fL (ref 78.0–100.0)
Monocytes Absolute: 0.9 10*3/uL (ref 0.1–1.0)
Monocytes Relative: 10 % (ref 3–12)
NEUTROS ABS: 7.5 10*3/uL (ref 1.7–7.7)
Neutrophils Relative %: 81 % — ABNORMAL HIGH (ref 43–77)
Platelets: 210 10*3/uL (ref 150–400)
RBC: 4.3 MIL/uL (ref 3.87–5.11)
RDW: 13.1 % (ref 11.5–15.5)
WBC: 9.3 10*3/uL (ref 4.0–10.5)

## 2015-02-26 LAB — I-STAT CG4 LACTIC ACID, ED: Lactic Acid, Venous: 1.46 mmol/L (ref 0.5–2.0)

## 2015-02-26 LAB — I-STAT TROPONIN, ED: Troponin i, poc: 0 ng/mL (ref 0.00–0.08)

## 2015-02-26 MED ORDER — SODIUM CHLORIDE 0.9 % IV SOLN: 1000.0000 mL | INTRAVENOUS | Status: DC

## 2015-02-26 MED ORDER — LEVOFLOXACIN 750 MG PO TABS
750.0000 mg | ORAL_TABLET | Freq: Once | ORAL | Status: AC
  Administered 2015-02-26: 750 mg via ORAL
  Filled 2015-02-26: qty 1

## 2015-02-26 MED ORDER — SODIUM CHLORIDE 0.9 % IV SOLN
1000.0000 mL | Freq: Once | INTRAVENOUS | Status: AC
  Administered 2015-02-26: 1000 mL via INTRAVENOUS

## 2015-02-26 MED ORDER — LEVOFLOXACIN 500 MG PO TABS: 500.0000 mg | ORAL_TABLET | Freq: Every day | ORAL | Status: AC

## 2015-02-26 MED ORDER — ACETAMINOPHEN 325 MG PO TABS
650.0000 mg | ORAL_TABLET | Freq: Once | ORAL | Status: AC
  Administered 2015-02-26: 650 mg via ORAL
  Filled 2015-02-26: qty 2

## 2015-02-26 NOTE — ED Provider Notes (Signed)
CSN: [id number]     Arrival date & time 02/26/15  5284 History   First MD Initiated Contact with Patient 02/26/15 [phone]     Chief Complaint  Patient presents with  . Cough     (Consider location/radiation/quality/duration/timing/severity/associated sxs/prior Treatment) Patient is a 64 y.o. female presenting with cough. The history is provided by the patient.  Cough Cough characteristics:  Productive Sputum characteristics:  White Severity:  Severe Onset quality:  Gradual Duration:  6 days Timing:  Constant Progression:  Worsening Chronicity:  New Smoker: no   Context: upper respiratory infection   Relieved by:  Nothing Worsened by:  Nothing tried Ineffective treatments: nyquil. Associated symptoms: chills, fever, myalgias and sore throat   Associated symptoms: no rash, no shortness of breath and no wheezing   Associated symptoms comment:  Diarrhea.  Also 3 episodes of syncope since yesterday when she gets up and starts walking she becomes lightheaded and is able to get herself to the floor before she loses consciousness. Risk factors: no recent infection and no recent travel     Past Medical History  Diagnosis Date  . Wears glasses    Past Surgical History  Procedure Laterality Date  . Dental surgery    . Colonoscopy    . Orif fibula fracture Right 01/26/2014    Procedure: Right ankle open reduction internal fixation fibula fracture;  Surgeon: Mable Paris, MD;  Location: [facility];  Service: Orthopedics;  Laterality: Right;  Right ankle open reduction internal fixation fibula fracture   History reviewed. No pertinent family history. History  Substance Use Topics  . Smoking status: Never Smoker   . Smokeless tobacco: Not on file  . Alcohol Use: Yes     Comment: Social   OB History    No data available     Review of Systems  Constitutional: Positive for fever and chills.  HENT: Positive for sore throat.   Respiratory: Positive for  cough. Negative for shortness of breath and wheezing.   Musculoskeletal: Positive for myalgias.  Skin: Negative for rash.  All other systems reviewed and are negative.     Allergies  Penicillins  Home Medications   Prior to Admission medications   Medication Sig Start Date End Date Taking? Authorizing Provider  docusate sodium (COLACE) 100 MG capsule Take 1 capsule (100 mg total) by mouth 3 (three) times daily as needed. 01/26/14   Jiles Harold, PA-C  oxyCODONE-acetaminophen (ROXICET) 5-325 MG per tablet Take 1-2 tablets by mouth every 4 (four) hours as needed for severe pain. 01/26/14   Danielle Laliberte, PA-C   BP 105/86 mmHg  Pulse 90  Temp(Src) 102.3 F (39.1 C) (Oral)  Resp 18  SpO2 98% Physical Exam  Constitutional: She is oriented to person, place, and time. She appears well-developed and well-nourished. No distress.  HENT:  Head: Normocephalic and atraumatic.  Mouth/Throat: Posterior oropharyngeal erythema present.  Eyes: EOM are normal. Pupils are equal, round, and reactive to light.  Cardiovascular: Normal rate, regular rhythm, normal heart sounds and intact distal pulses.  Exam reveals no friction rub.   No murmur heard. Pulmonary/Chest: Effort normal. She has no wheezes. She has rhonchi in the right middle field, the right lower field and the left lower field. She has no rales.  Rubs in the rll  Abdominal: Soft. Bowel sounds are normal. She exhibits no distension. There is no tenderness. There is no rebound and no guarding.  Musculoskeletal: Normal range of motion. She exhibits no tenderness.  No edema  Neurological: She is alert and oriented to person, place, and time. No cranial nerve deficit.  Skin: Skin is warm and dry. No rash noted.  Psychiatric: She has a normal mood and affect. Her behavior is normal.  Nursing note and vitals reviewed.   ED Course  Procedures (including critical care time) Labs Review Labs Reviewed  CBC WITH  DIFFERENTIAL/PLATELET - Abnormal; Notable for the following:    Neutrophils Relative % 81 (*)    Lymphocytes Relative 9 (*)    All other components within normal limits  COMPREHENSIVE METABOLIC PANEL - Abnormal; Notable for the following:    Glucose, Bld 139 (*)    Creatinine, Ser 1.27 (*)    GFR calc non Af Amer 44 (*)    GFR calc Af Amer 51 (*)    All other components within normal limits  I-STAT CG4 LACTIC ACID, ED  I-STAT CG4 LACTIC ACID, ED  Rosezena SensorI-STAT TROPOININ, ED    Imaging Review Dg Chest 2 View  02/26/2015   CLINICAL DATA:  64 year old female with a history of cough and fever for 5 days  EXAM: CHEST - 2 VIEW  COMPARISON:  08/03/2004  FINDINGS: Cardiomediastinal silhouette within normal limits in size and contour.  Ill-defined opacity adjacent to the right heart border, without evidence of middle lobe volume loss on the lateral view.  Stigmata of emphysema, with increased retrosternal airspace, flattened hemidiaphragms, increased AP diameter, and hyperinflation on the AP view.  No pneumothorax or pleural effusion.  No displaced fracture.  Unremarkable appearance of the upper abdomen.  IMPRESSION: Ill-defined opacity at the medial right base, concerning for infection given the patient's history. Recommend repeat plain film imaging once the patient has been treated to assure resolution.  Changes of emphysema.  Signed,  Yvone NeuJaime S. Loreta AveWagner, DO  [facility]  [facility]  Signed,  Yvone NeuJaime S. Loreta AveWagner, DO  [facility]  [facility]   Electronically Signed   By: Gilmer MorJaime  Wagner D.O.   On: 02/26/2015 11:23     EKG Interpretation   Date/Time:  Friday February 26 2015 09:57:48 EDT Ventricular Rate:  85 PR Interval:  156 QRS Duration: 77 QT Interval:  472 QTC Calculation: 561 R Axis:   65 Text Interpretation:  Sinus rhythm Borderline repolarization abnormality  Prolonged QT interval Baseline wander in lead(s) V1  No significant change  since last tracing Confirmed by Anitra LauthPLUNKETT  MD, Alphonzo LemmingsWHITNEY ([id number]) on 02/26/2015  10:09:35 AM      MDM   Final diagnoses:  CAP (community acquired pneumonia)  Dehydration    Patient presenting today with concern for possible pneumonia versus flu. Cough, sore throat, fever, chills, anorexia, diarrhea since Monday. Patient denies shortness of breath. No abdominal pain on exam rashes or distal edema. Patient denies any urinary symptoms.  Also patient states she's had 3 syncopal events in the last 2 days usually when she gets up and tries to do something. She states she becomes very lightheaded and has to lay down.  On exam here patient is febrile to 102.3. She has some mild rhonchi and rubs on the right.  CBC, CMP, lactate, EKG, troponin, chest x-ray pending. Patient given IV fluids and fever control.  11:46 AM Patient's labs are consistent with dehydration with increasing creatinine from 0.7-1.2 but a normal lactic acid, troponin and CBC. Chest x-ray consistent with a right-sided pneumonia which is consistent with exam. After IV fluids patient is feeling much better. Blood pressure has  remained normal. Will treat patient for community-acquired pneumonia with Levaquin. Will ambulate patient here and if she is feeling better we'll discharge home to return if symptoms worsen.  Gwyneth Sprout, MD 02/26/15 1304

## 2015-02-26 NOTE — ED Notes (Signed)
Pt here for cough, congestion since Monday. sts hurts to swallow.

## 2015-02-26 NOTE — ED Notes (Signed)
Patient transported to X-ray 

## 2015-02-26 NOTE — Discharge Instructions (Signed)
Dehydration, Adult Dehydration is when you lose more fluids from the body than you take in. Vital organs like the kidneys, brain, and heart cannot function without a proper amount of fluids and salt. Any loss of fluids from the body can cause dehydration.  CAUSES   Vomiting.  Diarrhea.  Excessive sweating.  Excessive urine output.  Fever. SYMPTOMS  Mild dehydration  Thirst.  Dry lips.  Slightly dry mouth. Moderate dehydration  Very dry mouth.  Sunken eyes.  Skin does not bounce back quickly when lightly pinched and released.  Dark urine and decreased urine production.  Decreased tear production.  Headache. Severe dehydration  Very dry mouth.  Extreme thirst.  Rapid, weak pulse (more than 100 beats per minute at rest).  Cold hands and feet.  Not able to sweat in spite of heat and temperature.  Rapid breathing.  Blue lips.  Confusion and lethargy.  Difficulty being awakened.  Minimal urine production.  No tears. DIAGNOSIS  Your caregiver will diagnose dehydration based on your symptoms and your exam. Blood and urine tests will help confirm the diagnosis. The diagnostic evaluation should also identify the cause of dehydration. TREATMENT  Treatment of mild or moderate dehydration can often be done at home by increasing the amount of fluids that you drink. It is best to drink small amounts of fluid more often. Drinking too much at one time can make vomiting worse. Refer to the home care instructions below. Severe dehydration needs to be treated at the hospital where you will probably be given intravenous (IV) fluids that contain water and electrolytes. HOME CARE INSTRUCTIONS   Ask your caregiver about specific rehydration instructions.  Drink enough fluids to keep your urine clear or pale yellow.  Drink small amounts frequently if you have nausea and vomiting.  Eat as you normally do.  Avoid:  Foods or drinks high in sugar.  Carbonated  drinks.  Juice.  Extremely hot or cold fluids.  Drinks with caffeine.  Fatty, greasy foods.  Alcohol.  Tobacco.  Overeating.  Gelatin desserts.  Wash your hands well to avoid spreading bacteria and viruses.  Only take over-the-counter or prescription medicines for pain, discomfort, or fever as directed by your caregiver.  Ask your caregiver if you should continue all prescribed and over-the-counter medicines.  Keep all follow-up appointments with your caregiver. SEEK MEDICAL CARE IF:  You have abdominal pain and it increases or stays in one area (localizes).  You have a rash, stiff neck, or severe headache.  You are irritable, sleepy, or difficult to awaken.  You are weak, dizzy, or extremely thirsty. SEEK IMMEDIATE MEDICAL CARE IF:   You are unable to keep fluids down or you get worse despite treatment.  You have frequent episodes of vomiting or diarrhea.  You have blood or green matter (bile) in your vomit.  You have blood in your stool or your stool looks black and tarry.  You have not urinated in 6 to 8 hours, or you have only urinated a small amount of very dark urine.  You have a fever.  You faint. MAKE SURE YOU:   Understand these instructions.  Will watch your condition.  Will get help right away if you are not doing well or get worse. Document Released: 11/20/2005 Document Revised: 02/12/2012 Document Reviewed: 07/10/2011 Exit[employer] Patient Information 2015 [employer]. This information is not intended to replace advice given to you by your health care provider. Make sure you discuss any questions you have with your health care   provider.  

## 2015-03-11 ENCOUNTER — Other Ambulatory Visit (HOSPITAL_COMMUNITY): Payer: Self-pay | Admitting: Obstetrics and Gynecology

## 2015-03-11 DIAGNOSIS — Z1231 Encounter for screening mammogram for malignant neoplasm of breast: Secondary | ICD-10-CM

## 2015-03-15 ENCOUNTER — Ambulatory Visit (HOSPITAL_COMMUNITY): Payer: BC Managed Care – PPO | Attending: Obstetrics and Gynecology

## 2020-01-15 ENCOUNTER — Other Ambulatory Visit: Payer: Self-pay | Admitting: Family Medicine

## 2020-01-15 DIAGNOSIS — M810 Age-related osteoporosis without current pathological fracture: Secondary | ICD-10-CM

## 2020-04-05 ENCOUNTER — Other Ambulatory Visit: Payer: Self-pay

## 2020-04-05 ENCOUNTER — Ambulatory Visit
Admission: RE | Admit: 2020-04-05 | Discharge: 2020-04-05 | Disposition: A | Discharge status: Home / Self Care | Payer: BC Managed Care – PPO | Source: Ambulatory Visit | Attending: Family Medicine | Admitting: Family Medicine

## 2020-04-05 DIAGNOSIS — M85852 Other specified disorders of bone density and structure, left thigh: Secondary | ICD-10-CM | POA: Diagnosis not present

## 2020-04-05 DIAGNOSIS — Z780 Asymptomatic menopausal state: Secondary | ICD-10-CM | POA: Diagnosis not present

## 2020-04-05 DIAGNOSIS — M810 Age-related osteoporosis without current pathological fracture: Secondary | ICD-10-CM

## 2020-05-11 DIAGNOSIS — K641 Second degree hemorrhoids: Secondary | ICD-10-CM | POA: Diagnosis not present

## 2020-05-11 DIAGNOSIS — Z1211 Encounter for screening for malignant neoplasm of colon: Secondary | ICD-10-CM | POA: Diagnosis not present

## 2020-08-30 DIAGNOSIS — Z1211 Encounter for screening for malignant neoplasm of colon: Secondary | ICD-10-CM | POA: Diagnosis not present

## 2020-09-07 DIAGNOSIS — Z124 Encounter for screening for malignant neoplasm of cervix: Secondary | ICD-10-CM | POA: Diagnosis not present

## 2020-09-07 DIAGNOSIS — D259 Leiomyoma of uterus, unspecified: Secondary | ICD-10-CM | POA: Diagnosis not present

## 2020-09-07 DIAGNOSIS — Z760 Encounter for issue of repeat prescription: Secondary | ICD-10-CM | POA: Diagnosis not present

## 2020-09-07 DIAGNOSIS — B009 Herpesviral infection, unspecified: Secondary | ICD-10-CM | POA: Diagnosis not present

## 2020-09-07 DIAGNOSIS — Z01419 Encounter for gynecological examination (general) (routine) without abnormal findings: Secondary | ICD-10-CM | POA: Diagnosis not present

## 2020-10-19 DIAGNOSIS — H903 Sensorineural hearing loss, bilateral: Secondary | ICD-10-CM | POA: Diagnosis not present

## 2021-02-24 DIAGNOSIS — H524 Presbyopia: Secondary | ICD-10-CM | POA: Diagnosis not present

## 2021-02-24 DIAGNOSIS — H25813 Combined forms of age-related cataract, bilateral: Secondary | ICD-10-CM | POA: Diagnosis not present

## 2021-02-24 DIAGNOSIS — H52203 Unspecified astigmatism, bilateral: Secondary | ICD-10-CM | POA: Diagnosis not present

## 2021-07-18 DIAGNOSIS — H903 Sensorineural hearing loss, bilateral: Secondary | ICD-10-CM | POA: Diagnosis not present

## 2021-07-27 DIAGNOSIS — U071 COVID-19: Secondary | ICD-10-CM | POA: Diagnosis not present

## 2021-07-27 DIAGNOSIS — R030 Elevated blood-pressure reading, without diagnosis of hypertension: Secondary | ICD-10-CM | POA: Diagnosis not present

## 2021-09-19 DIAGNOSIS — Z23 Encounter for immunization: Secondary | ICD-10-CM | POA: Diagnosis not present

## 2021-09-30 DIAGNOSIS — Z791 Long term (current) use of non-steroidal anti-inflammatories (NSAID): Secondary | ICD-10-CM | POA: Diagnosis not present

## 2021-09-30 DIAGNOSIS — E663 Overweight: Secondary | ICD-10-CM | POA: Diagnosis not present

## 2021-09-30 DIAGNOSIS — B009 Herpesviral infection, unspecified: Secondary | ICD-10-CM | POA: Diagnosis not present

## 2021-09-30 DIAGNOSIS — Z7983 Long term (current) use of bisphosphonates: Secondary | ICD-10-CM | POA: Diagnosis not present

## 2021-09-30 DIAGNOSIS — M8580 Other specified disorders of bone density and structure, unspecified site: Secondary | ICD-10-CM | POA: Diagnosis not present

## 2021-09-30 DIAGNOSIS — R030 Elevated blood-pressure reading, without diagnosis of hypertension: Secondary | ICD-10-CM | POA: Diagnosis not present

## 2021-09-30 DIAGNOSIS — G8929 Other chronic pain: Secondary | ICD-10-CM | POA: Diagnosis not present

## 2021-09-30 DIAGNOSIS — M1990 Unspecified osteoarthritis, unspecified site: Secondary | ICD-10-CM | POA: Diagnosis not present

## 2021-09-30 DIAGNOSIS — Z6827 Body mass index (BMI) 27.0-27.9, adult: Secondary | ICD-10-CM | POA: Diagnosis not present

## 2022-01-06 DIAGNOSIS — E559 Vitamin D deficiency, unspecified: Secondary | ICD-10-CM | POA: Diagnosis not present

## 2022-01-06 DIAGNOSIS — E785 Hyperlipidemia, unspecified: Secondary | ICD-10-CM | POA: Diagnosis not present

## 2022-01-06 DIAGNOSIS — M25562 Pain in left knee: Secondary | ICD-10-CM | POA: Diagnosis not present

## 2022-01-06 DIAGNOSIS — M810 Age-related osteoporosis without current pathological fracture: Secondary | ICD-10-CM | POA: Diagnosis not present

## 2022-01-06 DIAGNOSIS — Z0000 Encounter for general adult medical examination without abnormal findings: Secondary | ICD-10-CM | POA: Diagnosis not present

## 2022-01-09 ENCOUNTER — Other Ambulatory Visit: Payer: Self-pay | Admitting: Family Medicine

## 2022-01-09 DIAGNOSIS — M810 Age-related osteoporosis without current pathological fracture: Secondary | ICD-10-CM

## 2022-01-25 DIAGNOSIS — M25562 Pain in left knee: Secondary | ICD-10-CM | POA: Diagnosis not present

## 2022-02-07 ENCOUNTER — Other Ambulatory Visit: Payer: Self-pay | Admitting: Family Medicine

## 2022-02-07 DIAGNOSIS — Z1231 Encounter for screening mammogram for malignant neoplasm of breast: Secondary | ICD-10-CM

## 2022-02-14 DIAGNOSIS — R52 Pain, unspecified: Secondary | ICD-10-CM | POA: Diagnosis not present

## 2022-02-14 DIAGNOSIS — U071 COVID-19: Secondary | ICD-10-CM | POA: Diagnosis not present

## 2022-02-14 DIAGNOSIS — R0981 Nasal congestion: Secondary | ICD-10-CM | POA: Diagnosis not present

## 2022-05-25 DIAGNOSIS — N870 Mild cervical dysplasia: Secondary | ICD-10-CM | POA: Diagnosis not present

## 2022-05-25 DIAGNOSIS — B009 Herpesviral infection, unspecified: Secondary | ICD-10-CM | POA: Diagnosis not present

## 2022-05-25 DIAGNOSIS — B029 Zoster without complications: Secondary | ICD-10-CM | POA: Diagnosis not present

## 2022-05-25 DIAGNOSIS — Z01419 Encounter for gynecological examination (general) (routine) without abnormal findings: Secondary | ICD-10-CM | POA: Diagnosis not present

## 2022-05-31 DIAGNOSIS — M79602 Pain in left arm: Secondary | ICD-10-CM | POA: Diagnosis not present

## 2022-05-31 DIAGNOSIS — R030 Elevated blood-pressure reading, without diagnosis of hypertension: Secondary | ICD-10-CM | POA: Diagnosis not present

## 2022-05-31 DIAGNOSIS — B029 Zoster without complications: Secondary | ICD-10-CM | POA: Diagnosis not present

## 2022-06-01 DIAGNOSIS — M79605 Pain in left leg: Secondary | ICD-10-CM | POA: Diagnosis not present

## 2022-06-27 DIAGNOSIS — Z803 Family history of malignant neoplasm of breast: Secondary | ICD-10-CM | POA: Diagnosis not present

## 2022-06-30 DIAGNOSIS — L309 Dermatitis, unspecified: Secondary | ICD-10-CM | POA: Diagnosis not present

## 2022-06-30 DIAGNOSIS — R030 Elevated blood-pressure reading, without diagnosis of hypertension: Secondary | ICD-10-CM | POA: Diagnosis not present

## 2022-06-30 DIAGNOSIS — M79606 Pain in leg, unspecified: Secondary | ICD-10-CM | POA: Diagnosis not present

## 2022-07-05 ENCOUNTER — Ambulatory Visit
Admission: RE | Admit: 2022-07-05 | Discharge: 2022-07-05 | Disposition: A | Discharge status: Home / Self Care | Payer: Medicare PPO | Source: Ambulatory Visit | Attending: Family Medicine | Admitting: Family Medicine

## 2022-07-05 DIAGNOSIS — M810 Age-related osteoporosis without current pathological fracture: Secondary | ICD-10-CM

## 2022-07-05 DIAGNOSIS — Z780 Asymptomatic menopausal state: Secondary | ICD-10-CM | POA: Diagnosis not present

## 2022-07-05 DIAGNOSIS — Z1231 Encounter for screening mammogram for malignant neoplasm of breast: Secondary | ICD-10-CM

## 2022-07-13 ENCOUNTER — Ambulatory Visit
Admission: RE | Admit: 2022-07-13 | Discharge: 2022-07-13 | Disposition: A | Discharge status: Home / Self Care | Payer: Medicare PPO | Source: Ambulatory Visit | Attending: Family Medicine | Admitting: Family Medicine

## 2022-07-13 ENCOUNTER — Ambulatory Visit: Payer: Medicare PPO

## 2022-07-13 DIAGNOSIS — Z1231 Encounter for screening mammogram for malignant neoplasm of breast: Secondary | ICD-10-CM | POA: Diagnosis not present

## 2022-07-24 DIAGNOSIS — L255 Unspecified contact dermatitis due to plants, except food: Secondary | ICD-10-CM | POA: Diagnosis not present

## 2022-08-25 DIAGNOSIS — Z23 Encounter for immunization: Secondary | ICD-10-CM | POA: Diagnosis not present

## 2022-11-20 DIAGNOSIS — J209 Acute bronchitis, unspecified: Secondary | ICD-10-CM | POA: Diagnosis not present

## 2022-11-20 DIAGNOSIS — B309 Viral conjunctivitis, unspecified: Secondary | ICD-10-CM | POA: Diagnosis not present

## 2022-11-20 DIAGNOSIS — J069 Acute upper respiratory infection, unspecified: Secondary | ICD-10-CM | POA: Diagnosis not present

## 2023-01-10 DIAGNOSIS — M1712 Unilateral primary osteoarthritis, left knee: Secondary | ICD-10-CM | POA: Diagnosis not present

## 2023-01-10 DIAGNOSIS — M79605 Pain in left leg: Secondary | ICD-10-CM | POA: Diagnosis not present

## 2023-01-16 DIAGNOSIS — M810 Age-related osteoporosis without current pathological fracture: Secondary | ICD-10-CM | POA: Diagnosis not present

## 2023-01-16 DIAGNOSIS — M25562 Pain in left knee: Secondary | ICD-10-CM | POA: Diagnosis not present

## 2023-01-16 DIAGNOSIS — E785 Hyperlipidemia, unspecified: Secondary | ICD-10-CM | POA: Diagnosis not present

## 2023-01-16 DIAGNOSIS — E559 Vitamin D deficiency, unspecified: Secondary | ICD-10-CM | POA: Diagnosis not present

## 2023-01-16 DIAGNOSIS — Z0000 Encounter for general adult medical examination without abnormal findings: Secondary | ICD-10-CM | POA: Diagnosis not present

## 2023-01-23 DIAGNOSIS — E785 Hyperlipidemia, unspecified: Secondary | ICD-10-CM | POA: Diagnosis not present

## 2023-01-23 DIAGNOSIS — Z0000 Encounter for general adult medical examination without abnormal findings: Secondary | ICD-10-CM | POA: Diagnosis not present

## 2023-01-23 DIAGNOSIS — E559 Vitamin D deficiency, unspecified: Secondary | ICD-10-CM | POA: Diagnosis not present

## 2023-01-25 DIAGNOSIS — J069 Acute upper respiratory infection, unspecified: Secondary | ICD-10-CM | POA: Diagnosis not present

## 2023-02-22 DIAGNOSIS — B009 Herpesviral infection, unspecified: Secondary | ICD-10-CM | POA: Diagnosis not present

## 2023-02-22 DIAGNOSIS — N952 Postmenopausal atrophic vaginitis: Secondary | ICD-10-CM | POA: Diagnosis not present

## 2023-05-24 DIAGNOSIS — Z124 Encounter for screening for malignant neoplasm of cervix: Secondary | ICD-10-CM | POA: Diagnosis not present

## 2023-05-24 DIAGNOSIS — R87611 Atypical squamous cells cannot exclude high grade squamous intraepithelial lesion on cytologic smear of cervix (ASC-H): Secondary | ICD-10-CM | POA: Diagnosis not present

## 2023-05-24 DIAGNOSIS — Z113 Encounter for screening for infections with a predominantly sexual mode of transmission: Secondary | ICD-10-CM | POA: Diagnosis not present

## 2023-05-24 DIAGNOSIS — L282 Other prurigo: Secondary | ICD-10-CM | POA: Diagnosis not present

## 2023-05-24 DIAGNOSIS — Z01419 Encounter for gynecological examination (general) (routine) without abnormal findings: Secondary | ICD-10-CM | POA: Diagnosis not present

## 2023-05-24 DIAGNOSIS — Z01411 Encounter for gynecological examination (general) (routine) with abnormal findings: Secondary | ICD-10-CM | POA: Diagnosis not present

## 2023-05-24 DIAGNOSIS — S81801A Unspecified open wound, right lower leg, initial encounter: Secondary | ICD-10-CM | POA: Diagnosis not present

## 2023-07-05 DIAGNOSIS — Z1231 Encounter for screening mammogram for malignant neoplasm of breast: Secondary | ICD-10-CM | POA: Diagnosis not present

## 2023-07-05 DIAGNOSIS — M25562 Pain in left knee: Secondary | ICD-10-CM | POA: Diagnosis not present

## 2023-07-05 DIAGNOSIS — M1712 Unilateral primary osteoarthritis, left knee: Secondary | ICD-10-CM | POA: Diagnosis not present

## 2023-07-09 DIAGNOSIS — R87619 Unspecified abnormal cytological findings in specimens from cervix uteri: Secondary | ICD-10-CM | POA: Diagnosis not present

## 2023-07-10 DIAGNOSIS — R87612 Low grade squamous intraepithelial lesion on cytologic smear of cervix (LGSIL): Secondary | ICD-10-CM | POA: Diagnosis not present

## 2023-07-10 DIAGNOSIS — R87619 Unspecified abnormal cytological findings in specimens from cervix uteri: Secondary | ICD-10-CM | POA: Diagnosis not present

## 2023-07-24 DIAGNOSIS — R87619 Unspecified abnormal cytological findings in specimens from cervix uteri: Secondary | ICD-10-CM | POA: Diagnosis not present

## 2023-09-05 DIAGNOSIS — M1712 Unilateral primary osteoarthritis, left knee: Secondary | ICD-10-CM | POA: Diagnosis not present

## 2023-11-20 DIAGNOSIS — Z03818 Encounter for observation for suspected exposure to other biological agents ruled out: Secondary | ICD-10-CM | POA: Diagnosis not present

## 2023-11-20 DIAGNOSIS — J069 Acute upper respiratory infection, unspecified: Secondary | ICD-10-CM | POA: Diagnosis not present

## 2023-11-20 DIAGNOSIS — R0981 Nasal congestion: Secondary | ICD-10-CM | POA: Diagnosis not present

## 2024-01-24 DIAGNOSIS — M1712 Unilateral primary osteoarthritis, left knee: Secondary | ICD-10-CM | POA: Diagnosis not present

## 2024-01-29 DIAGNOSIS — Z0000 Encounter for general adult medical examination without abnormal findings: Secondary | ICD-10-CM | POA: Diagnosis not present

## 2024-01-29 DIAGNOSIS — M25562 Pain in left knee: Secondary | ICD-10-CM | POA: Diagnosis not present

## 2024-01-29 DIAGNOSIS — E559 Vitamin D deficiency, unspecified: Secondary | ICD-10-CM | POA: Diagnosis not present

## 2024-01-29 DIAGNOSIS — M810 Age-related osteoporosis without current pathological fracture: Secondary | ICD-10-CM | POA: Diagnosis not present

## 2024-01-29 DIAGNOSIS — E785 Hyperlipidemia, unspecified: Secondary | ICD-10-CM | POA: Diagnosis not present

## 2024-01-29 DIAGNOSIS — M25511 Pain in right shoulder: Secondary | ICD-10-CM | POA: Diagnosis not present

## 2024-03-12 DIAGNOSIS — E785 Hyperlipidemia, unspecified: Secondary | ICD-10-CM | POA: Diagnosis not present

## 2024-04-10 DIAGNOSIS — I10 Essential (primary) hypertension: Secondary | ICD-10-CM | POA: Diagnosis not present

## 2024-04-10 DIAGNOSIS — E785 Hyperlipidemia, unspecified: Secondary | ICD-10-CM | POA: Diagnosis not present

## 2024-04-16 DIAGNOSIS — M1712 Unilateral primary osteoarthritis, left knee: Secondary | ICD-10-CM | POA: Diagnosis not present

## 2024-04-23 DIAGNOSIS — I10 Essential (primary) hypertension: Secondary | ICD-10-CM | POA: Diagnosis not present

## 2024-04-23 DIAGNOSIS — Z01818 Encounter for other preprocedural examination: Secondary | ICD-10-CM | POA: Diagnosis not present

## 2024-04-23 DIAGNOSIS — M25569 Pain in unspecified knee: Secondary | ICD-10-CM | POA: Diagnosis not present

## 2024-05-25 DIAGNOSIS — L989 Disorder of the skin and subcutaneous tissue, unspecified: Secondary | ICD-10-CM | POA: Diagnosis not present

## 2024-07-30 ENCOUNTER — Other Ambulatory Visit (HOSPITAL_COMMUNITY)

## 2024-08-05 ENCOUNTER — Ambulatory Visit (HOSPITAL_COMMUNITY): Admit: 2024-08-05 | Admitting: Orthopedic Surgery

## 2024-08-05 SURGERY — ARTHROPLASTY, KNEE, TOTAL
Anesthesia: Spinal | Site: Knee | Laterality: Left

## 2024-09-26 DIAGNOSIS — M1712 Unilateral primary osteoarthritis, left knee: Secondary | ICD-10-CM | POA: Diagnosis not present

## 2024-10-24 DIAGNOSIS — M1712 Unilateral primary osteoarthritis, left knee: Secondary | ICD-10-CM | POA: Diagnosis not present

## 2024-10-24 DIAGNOSIS — M25562 Pain in left knee: Secondary | ICD-10-CM | POA: Diagnosis not present
